# Patient Record
Sex: Male | Born: 1970 | Race: White | Hispanic: No | Marital: Single | Smoking: Current every day smoker
Health system: Southern US, Community
[De-identification: ages and names within clinical notes are randomized; demographics above are authoritative.]

## PROBLEM LIST (undated history)

## (undated) DIAGNOSIS — R011 Cardiac murmur, unspecified: Secondary | ICD-10-CM

---

## 2018-05-20 ENCOUNTER — Emergency Department
Admission: EM | Admit: 2018-05-20 | Discharge: 2018-05-20 | Disposition: A | Discharge status: Home / Self Care | Payer: Self-pay | Attending: Emergency Medicine | Admitting: Emergency Medicine

## 2018-05-20 ENCOUNTER — Emergency Department: Payer: Self-pay

## 2018-05-20 ENCOUNTER — Encounter: Payer: Self-pay | Admitting: Emergency Medicine

## 2018-05-20 DIAGNOSIS — Z87891 Personal history of nicotine dependence: Secondary | ICD-10-CM | POA: Insufficient documentation

## 2018-05-20 DIAGNOSIS — K2900 Acute gastritis without bleeding: Secondary | ICD-10-CM | POA: Insufficient documentation

## 2018-05-20 HISTORY — DX: Cardiac murmur, unspecified: R01.1

## 2018-05-20 LAB — CBC
HCT: 36.5 % — ABNORMAL LOW (ref 40.0–52.0)
Hemoglobin: 12.3 g/dL — ABNORMAL LOW (ref 13.0–18.0)
MCH: 27.6 pg (ref 26.0–34.0)
MCHC: 33.8 g/dL (ref 32.0–36.0)
MCV: 81.7 fL (ref 80.0–100.0)
Platelets: 361 10*3/uL (ref 150–440)
RBC: 4.46 MIL/uL (ref 4.40–5.90)
RDW: 15.1 % — AB (ref 11.5–14.5)
WBC: 11 10*3/uL — AB (ref 3.8–10.6)

## 2018-05-20 LAB — HEPATIC FUNCTION PANEL
ALBUMIN: 3.3 g/dL — AB (ref 3.5–5.0)
ALT: 12 U/L (ref 0–44)
AST: 20 U/L (ref 15–41)
Alkaline Phosphatase: 49 U/L (ref 38–126)
BILIRUBIN TOTAL: 0.5 mg/dL (ref 0.3–1.2)
Bilirubin, Direct: <0.1 mg/dL (ref 0.0–0.2)
Total Protein: 6.4 g/dL — ABNORMAL LOW (ref 6.5–8.1)

## 2018-05-20 LAB — BASIC METABOLIC PANEL
Anion gap: 6 (ref 5–15)
BUN: 8 mg/dL (ref 6–20)
CO2: 26 mmol/L (ref 22–32)
Calcium: 8.7 mg/dL — ABNORMAL LOW (ref 8.9–10.3)
Chloride: 105 mmol/L (ref 98–111)
Creatinine, Ser: 0.96 mg/dL (ref 0.61–1.24)
GFR calc Af Amer: >60 mL/min (ref >60)
GFR calc non Af Amer: >60 mL/min (ref >60)
Glucose, Bld: 142 mg/dL — ABNORMAL HIGH (ref 70–99)
Potassium: 3.9 mmol/L (ref 3.5–5.1)
SODIUM: 137 mmol/L (ref 135–145)

## 2018-05-20 LAB — LIPASE, BLOOD: Lipase: 30 U/L (ref 11–51)

## 2018-05-20 LAB — TROPONIN I: Troponin I: <0.03 ng/mL (ref <0.03)

## 2018-05-20 MED ORDER — HYDROCODONE-ACETAMINOPHEN 5-325 MG PO TABS: 1.0000 | ORAL_TABLET | ORAL | 0 refills | Status: AC | PRN

## 2018-05-20 MED ORDER — PANTOPRAZOLE SODIUM 40 MG PO TBEC: 40.0000 mg | DELAYED_RELEASE_TABLET | Freq: Every day | ORAL | 1 refills | Status: AC

## 2018-05-20 MED ORDER — ALUM & MAG HYDROXIDE-SIMETH 200-200-20 MG/5ML PO SUSP: 30.0000 mL | ORAL | 0 refills | Status: AC | PRN

## 2018-05-20 MED ORDER — GI COCKTAIL ~~LOC~~
30.0000 mL | Freq: Once | ORAL | Status: AC
  Administered 2018-05-20: 30 mL via ORAL
  Filled 2018-05-20: qty 30

## 2018-05-20 NOTE — ED Provider Notes (Signed)
[facility] Emergency Department Provider Note  Time seen: 1:13 PM  I have reviewed the triage vital signs and the nursing notes.   HISTORY  Chief Complaint Abdominal Pain and Chest Pain    HPI Raymond Ellis is a 47 y.o. male with no significant past medical history presents to the emergency department for lower chest/upper abdominal pain.  According to the patient for the past 1 week he has been experiencing a burning sensation across his lower chest and upper abdomen.  States pain across the upper abdomen as well.  Does state intermittent nausea vomiting over the past 1 week denies any diarrhea.  Denies any dysuria hematuria.  No lower abdominal pain.  No known fever cough or congestion.  The patient does smoke cigarettes, denies alcohol use.  Denies any history of pancreatitis or liver disease.   Past Medical History:  Diagnosis Date  . Heart murmur     There are no active problems to display for this patient.   History reviewed. No pertinent surgical history.  Prior to Admission medications   Not on File    Not on File  No family history on file.  Social History Social History   Tobacco Use  . Smoking status: Current Every Day Smoker  . Smokeless tobacco: Former Engineer, water Use Topics  . Alcohol use: Not Currently    Frequency: Never  . Drug use: Not on file    Review of Systems Constitutional: Negative for fever. Eyes: Negative for visual complaints ENT: Negative for recent illness/congestion Cardiovascular: Mild burning across the lower chest/upper abdomen Respiratory: Negative for shortness of breath. Gastrointestinal: Burning across the upper abdomen.  Positive intermittent nausea vomiting.  Negative for diarrhea. Genitourinary: Negative for urinary compaints Musculoskeletal: Negative for musculoskeletal complaints Skin: Negative for skin complaints  Neurological: Negative for headache All other ROS  negative  ____________________________________________   PHYSICAL EXAM:  VITAL SIGNS: ED Triage Vitals  Enc Vitals Group     BP 05/20/18 1231 105/66     Pulse Rate 05/20/18 1231 76     Resp 05/20/18 1231 20     Temp 05/20/18 1231 97.9 F (36.6 C)     Temp Source 05/20/18 1231 Oral     SpO2 05/20/18 1231 95 %     Weight 05/20/18 1234 175 lb (79.4 kg)     Height 05/20/18 1234 5\' 10"  (1.778 m)     Head Circumference --      Peak Flow --      Pain Score 05/20/18 1234 7     Pain Loc --      Pain Edu? --      Excl. in GC? --    Constitutional: Alert and oriented. Well appearing and in no distress. Eyes: Normal exam ENT   Head: Normocephalic and atraumatic   Mouth/Throat: Mucous membranes are moist. Cardiovascular: Normal rate, regular rhythm. No murmur Respiratory: Normal respiratory effort without tachypnea nor retractions. Breath sounds are clear  Gastrointestinal: Soft, mild tenderness across the entire upper abdomen especially in the epigastrium.  No rebound guarding or distention.  Benign lower abdominal exam. Musculoskeletal: Nontender with normal range of motion in all extremities.  Neurologic:  Normal speech and language. No gross focal neurologic deficits  Skin:  Skin is warm, dry and intact.  Psychiatric: Mood and affect are normal. Speech and behavior are normal.   ____________________________________________    EKG  EKG reviewed and interpreted by myself shows normal sinus rhythm at 64 bpm with  a narrow QRS, normal axis, normal intervals, no concerning ST changes.  Reassuring EKG.  ____________________________________________    RADIOLOGY  Chest x-ray is negative  ____________________________________________   INITIAL IMPRESSION / ASSESSMENT AND PLAN / ED COURSE  Pertinent labs & imaging results that were available during my care of the patient were reviewed by me and considered in my medical decision making (see chart for details).  Patient  presents to the emergency department with 1 week of intermittent burning across the upper abdomen and lower chest.  Differential would include ACS, gastritis, gastric or peptic ulcer disease, gallbladder disease, pancreatitis.  We will check labs including LFTs and lipase as well as cardiac enzymes, EKG and chest x-ray.  Overall the patient appears very well.  Will attempt treatment with a GI cocktail.  Patient's work-up is largely nonrevealing, lipase has resulted normal.  LFTs are largely normal.  Troponin normal EKG is reassuring chest x-ray is negative.  Given the patient's location of his discomfort I highly suspect gastritis.  He did state some improvement after GI cocktail.  We will discharge with short course of pain medication, Protonix and have the patient follow-up with GI medicine.  I also discussed use of over-the-counter Maalox.  Patient agreeable to plan of care.  ____________________________________________   FINAL CLINICAL IMPRESSION(S) / ED DIAGNOSES  Lower chest/upper abdominal pain Gastritis   Minna Antis, MD 05/20/18 1527

## 2018-05-20 NOTE — ED Notes (Signed)
Patient transported to X-ray 

## 2018-05-20 NOTE — ED Triage Notes (Signed)
Pt arrived with complaints of upper abdominal and left sided chest pain that has been intermittent for a week +. Pt also reports shortness of breath. Pt reports 1 episode of emesis this morning

## 2018-05-27 ENCOUNTER — Encounter: Payer: Self-pay | Admitting: Gastroenterology

## 2018-05-27 ENCOUNTER — Other Ambulatory Visit: Payer: Self-pay

## 2018-05-27 ENCOUNTER — Ambulatory Visit (INDEPENDENT_AMBULATORY_CARE_PROVIDER_SITE_OTHER): Payer: Self-pay | Admitting: Gastroenterology

## 2018-05-27 VITALS — BP 102/70 | HR 65 | Resp 16 | Wt 173.4 lb

## 2018-05-27 DIAGNOSIS — G8929 Other chronic pain: Secondary | ICD-10-CM

## 2018-05-27 DIAGNOSIS — R1013 Epigastric pain: Principal | ICD-10-CM

## 2018-05-27 DIAGNOSIS — Z720 Tobacco use: Secondary | ICD-10-CM

## 2018-05-27 NOTE — Progress Notes (Signed)
Arlyss Repress, MD [address]  [address]  [place], [place] [address]  Main: [phone]  Fax: [phone]    Gastroenterology Consultation  Referring Provider:     No ref. provider found Primary Care Physician:  Patient, No Pcp Per Primary Gastroenterologist:  Dr. Arlyss Repress Reason for Consultation:     Chronic epigastric pain        HPI:   Raymond Ellis is a 47 y.o. male referred from [facility] ER for consultation & management of chronic epigastric pain. Patient reports that the pain started approximately 6 months ago, pressure-like associated with early satiety, intermittent,experiences almost on a daily basis. He reports that due to his work, he generally eats once a day only. He reports the pain has become progressive, went to ER about a week ago as it got worse and associated with nausea and vomiting. In the ER, he was found to have mild normocytic anemia, lipase was normal, normal LFTs. He did not undergo any abdominal imaging. He was discharged home on Protonix and Maalox with follow-up with GI Patient reports that pain is persistent. He denies diarrhea, however, unable to tolerate milk products He denies taking alcohol, quit about an year ago He smokes 1 pack of cigarettes daily for more than 25 years He works as a Copywriter, advertising for [employer] NSAIDs: none  Antiplts/Anticoagulants/Anti thrombotics: none  GI Procedures: none He denies any GI surgeries He denies family history of GI malignancy  Past Medical History:  Diagnosis Date  . Heart murmur     No past surgical history on file.   Current Outpatient Medications:  .  alum & mag hydroxide-simeth (MAALOX/MYLANTA) 200-200-20 MG/5ML suspension, Take 30 mLs by mouth every 4 (four) hours as needed for indigestion or heartburn., Disp: 355 mL, Rfl: 0 .  pantoprazole (PROTONIX) 40 MG tablet, Take 1 tablet (40 mg total) by mouth daily., Disp: 30 tablet, Rfl: 1 .  HYDROcodone-acetaminophen  (NORCO/VICODIN) 5-325 MG tablet, Take 1 tablet by mouth every 4 (four) hours as needed. (Patient not taking: Reported on 05/27/2018), Disp: 8 tablet, Rfl: 0   No family history on file.   Social History   Tobacco Use  . Smoking status: Current Every Day Smoker  . Smokeless tobacco: Former Engineer, water Use Topics  . Alcohol use: Not Currently    Frequency: Never  . Drug use: Not on file    Allergies as of 05/27/2018  . (Not on File)    Review of Systems:    All systems reviewed and negative except where noted in HPI.   Physical Exam:  BP 102/70 (BP Location: Left Arm, Patient Position: Sitting, Cuff Size: Normal)   Pulse 65   Resp 16   Wt 173 lb 6.4 oz (78.7 kg)   BMI 24.88 kg/m  No LMP for male patient.  General:   Alert,  Well-developed, well-nourished, pleasant and cooperative in NAD Head:  Normocephalic and atraumatic. Eyes:  Sclera clear, no icterus.   Conjunctiva pink. Ears:  Normal auditory acuity. Nose:  No deformity, discharge, or lesions. Mouth:  No deformity or lesions,oropharynx pink & moist. Neck:  Supple; no masses or thyromegaly. Lungs:  Respirations even and unlabored.  Clear throughout to auscultation.   No wheezes, crackles, or rhonchi. No acute distress. Heart:  Regular rate and rhythm; no murmurs, clicks, rubs, or gallops. Abdomen:  Normal bowel sounds. Soft, Mild epigastric fullness and tenderness and non-distended without masses, hepatosplenomegaly or hernias noted.  No  guarding or rebound tenderness.   Rectal: Not performed Msk:  Symmetrical without gross deformities. Good, equal movement & strength bilaterally. Pulses:  Normal pulses noted. Extremities:  No clubbing or edema.  No cyanosis. Neurologic:  Alert and oriented x3;  grossly normal neurologically. Skin:  Intact without significant lesions or rashes. No jaundice. Lymph Nodes:  No significant cervical adenopathy. Psych:  Alert and cooperative. Normal mood and affect.  Imaging  Studies: none  Assessment and Plan:   Raymond Ellis is a 47 y.o. Caucasian male with chronic tobacco use seen in consultation for 6 month history of epigastric pain  Epigastric pain: differentials include peptic ulcer disease or H. Pylori infection or chronic pancreatitis or pancreatic malignancy Recommend EGD for further evaluation Recommend ultrasound abdomen If the above workup negative, recommend CT A/P to evaluate for pancreas continue Protonix in the meantime  Follow up in 4 weeks   Arlyss Repressohini R Kerina Simoneau, MD

## 2018-05-29 ENCOUNTER — Ambulatory Visit
Admission: RE | Admit: 2018-05-29 | Discharge: 2018-05-29 | Disposition: A | Discharge status: Home / Self Care | Payer: PRIVATE HEALTH INSURANCE | Source: Ambulatory Visit | Attending: Gastroenterology | Admitting: Gastroenterology

## 2018-05-29 DIAGNOSIS — R1013 Epigastric pain: Secondary | ICD-10-CM | POA: Diagnosis present

## 2018-05-29 DIAGNOSIS — K7689 Other specified diseases of liver: Secondary | ICD-10-CM | POA: Insufficient documentation

## 2018-05-29 DIAGNOSIS — G8929 Other chronic pain: Secondary | ICD-10-CM | POA: Diagnosis not present

## 2018-05-29 DIAGNOSIS — R918 Other nonspecific abnormal finding of lung field: Secondary | ICD-10-CM | POA: Diagnosis not present

## 2018-06-02 ENCOUNTER — Encounter: Payer: Self-pay | Admitting: *Deleted

## 2018-06-03 ENCOUNTER — Encounter
Admission: RE | Disposition: A | Discharge status: Home / Self Care | Payer: Self-pay | Source: Ambulatory Visit | Attending: Gastroenterology

## 2018-06-03 ENCOUNTER — Ambulatory Visit: Payer: PRIVATE HEALTH INSURANCE | Admitting: Anesthesiology

## 2018-06-03 ENCOUNTER — Encounter: Payer: Self-pay | Admitting: *Deleted

## 2018-06-03 ENCOUNTER — Other Ambulatory Visit: Payer: Self-pay

## 2018-06-03 ENCOUNTER — Ambulatory Visit
Admission: RE | Admit: 2018-06-03 | Discharge: 2018-06-03 | Disposition: A | Discharge status: Home / Self Care | Payer: PRIVATE HEALTH INSURANCE | Source: Ambulatory Visit | Attending: Gastroenterology | Admitting: Gastroenterology

## 2018-06-03 DIAGNOSIS — R011 Cardiac murmur, unspecified: Secondary | ICD-10-CM | POA: Diagnosis not present

## 2018-06-03 DIAGNOSIS — Z79899 Other long term (current) drug therapy: Secondary | ICD-10-CM | POA: Insufficient documentation

## 2018-06-03 DIAGNOSIS — K229 Disease of esophagus, unspecified: Secondary | ICD-10-CM

## 2018-06-03 DIAGNOSIS — F17200 Nicotine dependence, unspecified, uncomplicated: Secondary | ICD-10-CM | POA: Diagnosis not present

## 2018-06-03 DIAGNOSIS — R1013 Epigastric pain: Secondary | ICD-10-CM

## 2018-06-03 DIAGNOSIS — G8929 Other chronic pain: Secondary | ICD-10-CM | POA: Insufficient documentation

## 2018-06-03 DIAGNOSIS — K2960 Other gastritis without bleeding: Secondary | ICD-10-CM | POA: Insufficient documentation

## 2018-06-03 DIAGNOSIS — B9681 Helicobacter pylori [H. pylori] as the cause of diseases classified elsewhere: Secondary | ICD-10-CM | POA: Insufficient documentation

## 2018-06-03 HISTORY — PX: ESOPHAGOGASTRODUODENOSCOPY (EGD) WITH PROPOFOL: SHX5813

## 2018-06-03 SURGERY — ESOPHAGOGASTRODUODENOSCOPY (EGD) WITH PROPOFOL
Anesthesia: General

## 2018-06-03 MED ORDER — PROPOFOL 500 MG/50ML IV EMUL
INTRAVENOUS | Status: AC
  Filled 2018-06-03: qty 50

## 2018-06-03 MED ORDER — EPHEDRINE SULFATE 50 MG/ML IJ SOLN
INTRAMUSCULAR | Status: DC | PRN
  Administered 2018-06-03: 10 mg via INTRAVENOUS

## 2018-06-03 MED ORDER — MIDAZOLAM HCL 2 MG/2ML IJ SOLN
INTRAMUSCULAR | Status: DC | PRN
  Administered 2018-06-03: 2 mg via INTRAVENOUS

## 2018-06-03 MED ORDER — SODIUM CHLORIDE 0.9 % IV SOLN
INTRAVENOUS | Status: DC
  Administered 2018-06-03: 13:00:00 via INTRAVENOUS

## 2018-06-03 MED ORDER — LIDOCAINE HCL (PF) 2 % IJ SOLN
INTRAMUSCULAR | Status: AC
  Filled 2018-06-03: qty 10

## 2018-06-03 MED ORDER — MIDAZOLAM HCL 2 MG/2ML IJ SOLN
INTRAMUSCULAR | Status: AC
  Filled 2018-06-03: qty 2

## 2018-06-03 MED ORDER — FENTANYL CITRATE (PF) 100 MCG/2ML IJ SOLN
INTRAMUSCULAR | Status: AC
  Filled 2018-06-03: qty 2

## 2018-06-03 MED ORDER — LIDOCAINE HCL (CARDIAC) PF 100 MG/5ML IV SOSY
PREFILLED_SYRINGE | INTRAVENOUS | Status: DC | PRN
  Administered 2018-06-03: 30 mg via INTRAVENOUS

## 2018-06-03 MED ORDER — EPHEDRINE SULFATE 50 MG/ML IJ SOLN
INTRAMUSCULAR | Status: AC
  Filled 2018-06-03: qty 1

## 2018-06-03 MED ORDER — PROPOFOL 500 MG/50ML IV EMUL
INTRAVENOUS | Status: DC | PRN
  Administered 2018-06-03: 120 ug/kg/min via INTRAVENOUS

## 2018-06-03 MED ORDER — FENTANYL CITRATE (PF) 100 MCG/2ML IJ SOLN
INTRAMUSCULAR | Status: DC | PRN
  Administered 2018-06-03: 50 ug via INTRAVENOUS

## 2018-06-03 NOTE — Op Note (Signed)
[facility] Gastroenterology Patient Name: Raymond Ellis Procedure Date: 06/03/2018 1:30 PM MRN: [id number] Account #: [id number] Date of Birth: 1970-10-31 Admit Type: Outpatient Age: 47 Room: [facility] ENDO ROOM 4 Gender: Male Note Status: Finalized Procedure:            Upper GI endoscopy Indications:          Epigastric abdominal pain Providers:            Lin Landsman MD, MD Referring MD:         No Local Md, MD (Referring MD) Medicines:            Monitored Anesthesia Care Complications:        No immediate complications. Estimated blood loss: None. Procedure:            Pre-Anesthesia Assessment:                       - Prior to the procedure, a History and Physical was                        performed, and patient medications and allergies were                        reviewed. The patient is competent. The risks and                        benefits of the procedure and the sedation options and                        risks were discussed with the patient. All questions                        were answered and informed consent was obtained.                        Patient identification and proposed procedure were                        verified by the physician, the nurse, the                        anesthesiologist, the anesthetist and the technician in                        the pre-procedure area in the procedure room in the                        endoscopy suite. Mental Status Examination: alert and                        oriented. Airway Examination: normal oropharyngeal                        airway and neck mobility. Respiratory Examination:                        clear to auscultation. CV Examination: normal.                        Prophylactic Antibiotics: The patient does not require  prophylactic antibiotics. Prior Anticoagulants: The                        patient has taken no previous anticoagulant or   antiplatelet agents. ASA Grade Assessment: II - A                        patient with mild systemic disease. After reviewing the                        risks and benefits, the patient was deemed in                        satisfactory condition to undergo the procedure. The                        anesthesia plan was to use monitored anesthesia care                        (MAC). Immediately prior to administration of                        medications, the patient was re-assessed for adequacy                        to receive sedatives. The heart rate, respiratory rate,                        oxygen saturations, blood pressure, adequacy of                        pulmonary ventilation, and response to care were                        monitored throughout the procedure. The physical status                        of the patient was re-assessed after the procedure.                       After obtaining informed consent, the endoscope was                        passed under direct vision. Throughout the procedure,                        the patient's blood pressure, pulse, and oxygen                        saturations were monitored continuously. The Endoscope                        was introduced through the mouth, and advanced to the                        second part of duodenum. The upper GI endoscopy was                        accomplished without difficulty. The patient tolerated  the procedure well. Findings:      The duodenal bulb and second portion of the duodenum were normal.      The entire examined stomach was normal. Biopsies were taken with a cold       forceps for Helicobacter pylori testing.      The cardia and gastric fundus were normal on retroflexion.      Islands of salmon-colored mucosa were present at 38 cm. No other visible       abnormalities were present. Biopsies were taken with a cold forceps for       histology.      The examined esophagus was  normal. Impression:           - Normal duodenal bulb and second portion of the                        duodenum.                       - Normal stomach. Biopsied.                       - Salmon-colored mucosa. Biopsied.                       - Normal esophagus. Recommendation:       - Await pathology results.                       - Discharge patient to home (with escort).                       - Resume previous diet today.                       - Continue present medications.                       - Return to my office as previously scheduled. Procedure Code(s):    --- Professional ---                       [phone], Esophagogastroduodenoscopy, flexible, transoral;                        with biopsy, single or multiple Diagnosis Code(s):    --- Professional ---                       K22.8, Other specified diseases of esophagus                       R10.13, Epigastric pain CPT copyright 2017 American Medical Association. All rights reserved. The codes documented in this report are preliminary and upon coder review may  be revised to meet current compliance requirements. Dr. Ulyess Mort Lin Landsman MD, MD 06/03/2018 1:50:48 PM This report has been signed electronically. Number of Addenda: 0 Note Initiated On: 06/03/2018 1:30 PM      [facility]

## 2018-06-03 NOTE — Anesthesia Preprocedure Evaluation (Addendum)
Anesthesia Evaluation  Patient identified by MRN, date of birth, ID band Patient awake    Reviewed: Allergy & Precautions, H&P , NPO status , Patient's Chart, lab work & pertinent test results  Airway Mallampati: II  TM Distance: >3 FB Neck ROM: full   Comment: beard Dental  (+) Chipped, Missing   Pulmonary neg COPD, Current Smoker,     + decreased breath sounds      Cardiovascular (-) angina(-) Cardiac Stents, (-) CABG and (-) CHF negative cardio ROS  (-) dysrhythmias + Valvular Problems/Murmurs (murmur)  Rhythm:regular Rate:Normal     Neuro/Psych negative neurological ROS  negative psych ROS   GI/Hepatic negative GI ROS, Neg liver ROS,   Endo/Other  negative endocrine ROS  Renal/GU negative Renal ROS  negative genitourinary   Musculoskeletal   Abdominal   Peds  Hematology negative hematology ROS (+)   Anesthesia Other Findings Past Medical History: No date: Heart murmur  History reviewed. No pertinent surgical history.  BMI    Body Mass Index:  25.11 kg/m      Reproductive/Obstetrics negative OB ROS                            Anesthesia Physical Anesthesia Plan  ASA: II  Anesthesia Plan: General   Post-op Pain Management:    Induction:   PONV Risk Score and Plan: Propofol infusion and TIVA  Airway Management Planned: Natural Airway and Nasal Cannula  Additional Equipment:   Intra-op Plan:   Post-operative Plan:   Informed Consent: I have reviewed the patients History and Physical, chart, labs and discussed the procedure including the risks, benefits and alternatives for the proposed anesthesia with the patient or authorized representative who has indicated his/her understanding and acceptance.   Dental Advisory Given  Plan Discussed with: Anesthesiologist, CRNA and Surgeon  Anesthesia Plan Comments:         Anesthesia Quick Evaluation

## 2018-06-03 NOTE — Transfer of Care (Signed)
Immediate Anesthesia Transfer of Care Note  Patient: Raymond Ellis  Procedure(s) Performed: ESOPHAGOGASTRODUODENOSCOPY (EGD) WITH PROPOFOL (N/A )  Patient Location: PACU  Anesthesia Type:General  Level of Consciousness: awake and sedated  Airway & Oxygen Therapy: Patient Spontanous Breathing and Patient connected to nasal cannula oxygen  Post-op Assessment: Report given to RN and Post -op Vital signs reviewed and stable  Post vital signs: Reviewed and stable  Last Vitals:  Vitals Value Taken Time  BP 109/55 06/03/2018  1:51 PM  Temp 36.1 C 06/03/2018  1:50 PM  Pulse 78 06/03/2018  1:51 PM  Resp 15 06/03/2018  1:51 PM  SpO2 98 % 06/03/2018  1:51 PM  Vitals shown include unvalidated device data.  Last Pain:  Vitals:   06/03/18 1350  TempSrc: Tympanic  PainSc:       Patients Stated Pain Goal: 0 (06/03/18 1311)  Complications: No apparent anesthesia complications

## 2018-06-03 NOTE — Anesthesia Post-op Follow-up Note (Signed)
Anesthesia QCDR form completed.        

## 2018-06-03 NOTE — Anesthesia Procedure Notes (Signed)
Performed by: Cook-Martin, Rubie Ficco Pre-anesthesia Checklist: Patient identified, Emergency Drugs available, Suction available, Patient being monitored and Timeout performed Patient Re-evaluated:Patient Re-evaluated prior to induction Oxygen Delivery Method: Nasal cannula Preoxygenation: Pre-oxygenation with 100% oxygen Airway Equipment and Method: Bite block Placement Confirmation: positive ETCO2 and CO2 detector       

## 2018-06-03 NOTE — H&P (Signed)
Arlyss Repress, MD [address]  [address]  [place], [place] [address]  Main: [phone]  Fax: [phone] Pager: [phone]  Primary Care Physician:  Patient, No Pcp Per Primary Gastroenterologist:  Dr. Arlyss Repress  Pre-Procedure History & Physical: HPI:  Raymond Ellis is a 47 y.o. male is here for an endoscopy.   Past Medical History:  Diagnosis Date  . Heart murmur     History reviewed. No pertinent surgical history.  Prior to Admission medications   Medication Sig Start Date End Date Taking? Authorizing Provider  alum & mag hydroxide-simeth (MAALOX/MYLANTA) 200-200-20 MG/5ML suspension Take 30 mLs by mouth every 4 (four) hours as needed for indigestion or heartburn. 05/20/18  Yes Minna Antis, MD  HYDROcodone-acetaminophen (NORCO/VICODIN) 5-325 MG tablet Take 1 tablet by mouth every 4 (four) hours as needed. 05/20/18  Yes Minna Antis, MD  pantoprazole (PROTONIX) 40 MG tablet Take 1 tablet (40 mg total) by mouth daily. 05/20/18 05/20/19 Yes Minna Antis, MD    Allergies as of 05/27/2018  . (Not on File)    History reviewed. No pertinent family history.  Social History   Socioeconomic History  . Marital status: Single    Spouse name: Not on file  . Number of children: Not on file  . Years of education: Not on file  . Highest education level: Not on file  Occupational History  . Not on file  Social Needs  . Financial resource strain: Not on file  . Food insecurity:    Worry: Not on file    Inability: Not on file  . Transportation needs:    Medical: Not on file    Non-medical: Not on file  Tobacco Use  . Smoking status: Current Every Day Smoker  . Smokeless tobacco: Former Engineer, water and Sexual Activity  . Alcohol use: Not Currently    Frequency: Never  . Drug use: Never  . Sexual activity: Never  Lifestyle  . Physical activity:    Days per week: Not on file    Minutes per session: Not on file  . Stress: Not on file    Relationships  . Social connections:    Talks on phone: Not on file    Gets together: Not on file    Attends religious service: Not on file    Active member of club or organization: Not on file    Attends meetings of clubs or organizations: Not on file    Relationship status: Not on file  . Intimate partner violence:    Fear of current or ex partner: No    Emotionally abused: No    Physically abused: No    Forced sexual activity: Not on file  Other Topics Concern  . Not on file  Social History Narrative  . Not on file    Review of Systems: See HPI, otherwise negative ROS  Physical Exam: BP (!) 107/6   Pulse 61   Temp (!) 96.9 F (36.1 C) (Tympanic)   Resp 20   Ht 5\' 10"  (1.778 m)   Wt 79.4 kg   SpO2 99%   BMI 25.11 kg/m  General:   Alert,  pleasant and cooperative in NAD Head:  Normocephalic and atraumatic. Neck:  Supple; no masses or thyromegaly. Lungs:  Clear throughout to auscultation.    Heart:  Regular rate and rhythm. Abdomen:  Soft, nontender and nondistended. Normal bowel sounds, without guarding, and without rebound.   Neurologic:  Alert and  oriented x4;  grossly normal neurologically.  Impression/Plan: Raymond Ellis is here for an endoscopy to be performed for chronic epigastric pain  Risks, benefits, limitations, and alternatives regarding  endoscopy have been reviewed with the patient.  Questions have been answered.  All parties agreeable.   Lannette Donathohini Teshara Moree, MD  06/03/2018, 1:24 PM

## 2018-06-05 ENCOUNTER — Encounter: Payer: Self-pay | Admitting: Gastroenterology

## 2018-06-05 LAB — SURGICAL PATHOLOGY

## 2018-06-05 NOTE — Anesthesia Postprocedure Evaluation (Signed)
Anesthesia Post Note  Patient: Raymond Ellis  Procedure(s) Performed: ESOPHAGOGASTRODUODENOSCOPY (EGD) WITH PROPOFOL (N/A )  Patient location during evaluation: PACU Anesthesia Type: General Level of consciousness: awake and alert Pain management: pain level controlled Vital Signs Assessment: post-procedure vital signs reviewed and stable Respiratory status: spontaneous breathing, nonlabored ventilation and respiratory function stable Cardiovascular status: blood pressure returned to baseline and stable Postop Assessment: no apparent nausea or vomiting Anesthetic complications: no     Last Vitals:  Vitals:   06/03/18 1311 06/03/18 1350  BP: (!) 107/6 (!) 109/55  Pulse: 61   Resp: 20   Temp: (!) 36.1 C (!) 36.1 C  SpO2: 99%     Last Pain:  Vitals:   06/04/18 0755  TempSrc:   PainSc: 0-No pain                 Jovita Gamma

## 2018-06-06 ENCOUNTER — Other Ambulatory Visit: Payer: Self-pay

## 2018-06-06 DIAGNOSIS — A048 Other specified bacterial intestinal infections: Secondary | ICD-10-CM

## 2018-06-06 MED ORDER — OMEPRAZOLE 40 MG PO CPDR: 40.0000 mg | DELAYED_RELEASE_CAPSULE | Freq: Every day | ORAL | 0 refills | Status: AC

## 2018-06-06 MED ORDER — CLARITHROMYCIN 250 MG PO TABS: 250.0000 mg | ORAL_TABLET | Freq: Two times a day (BID) | ORAL | 0 refills | Status: AC

## 2018-06-06 MED ORDER — AMOXICILLIN 500 MG PO TABS: 500.0000 mg | ORAL_TABLET | Freq: Two times a day (BID) | ORAL | 0 refills | Status: AC

## 2018-07-01 ENCOUNTER — Other Ambulatory Visit: Payer: Self-pay

## 2018-07-01 ENCOUNTER — Encounter: Payer: Self-pay | Admitting: Gastroenterology

## 2018-07-01 ENCOUNTER — Ambulatory Visit: Payer: Self-pay | Admitting: Gastroenterology

## 2018-07-01 DIAGNOSIS — R1013 Epigastric pain: Secondary | ICD-10-CM

## 2018-11-19 IMAGING — US US ABDOMEN COMPLETE
1 series · 14 of 25 positions shown · non-contrast
Comparison: None.

CLINICAL DATA: Chronic epigastric abdominal pain.

EXAM:
ABDOMEN ULTRASOUND COMPLETE

[Series 1: us abdomen complete · 14 of 99 slices shown]
[im 1/99]
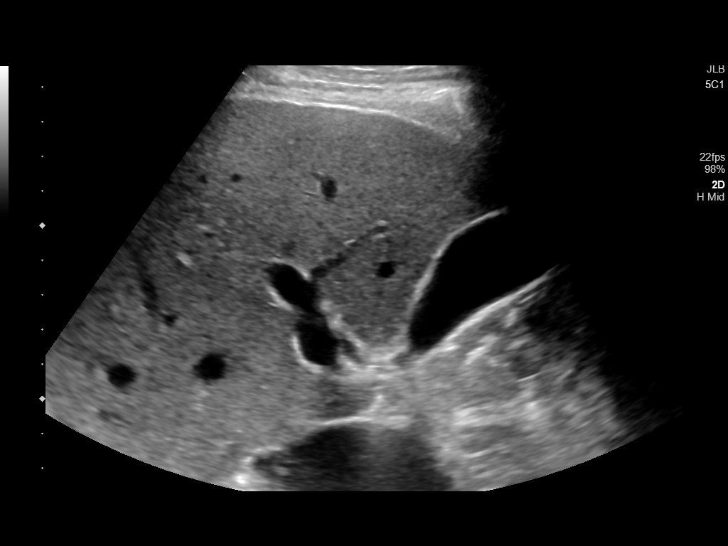
[im 9/99]
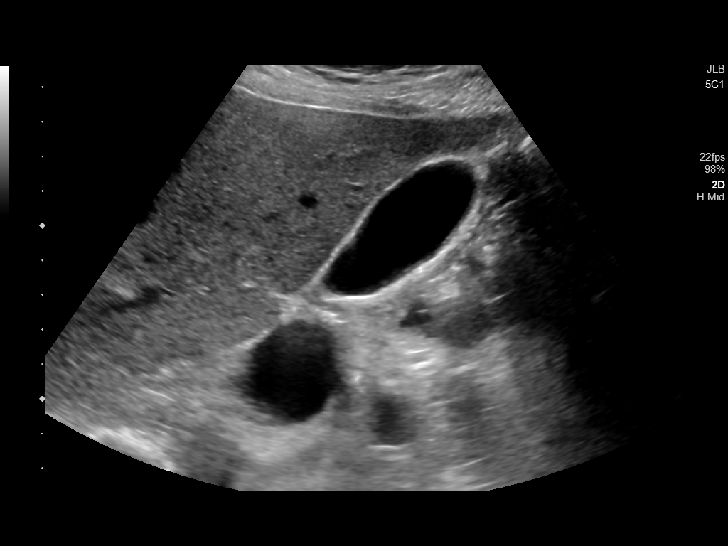
[im 17/99]
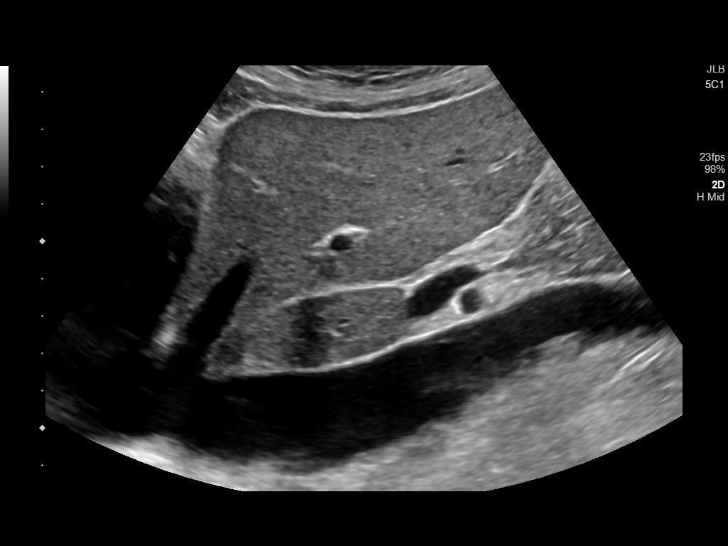
[im 25/99]
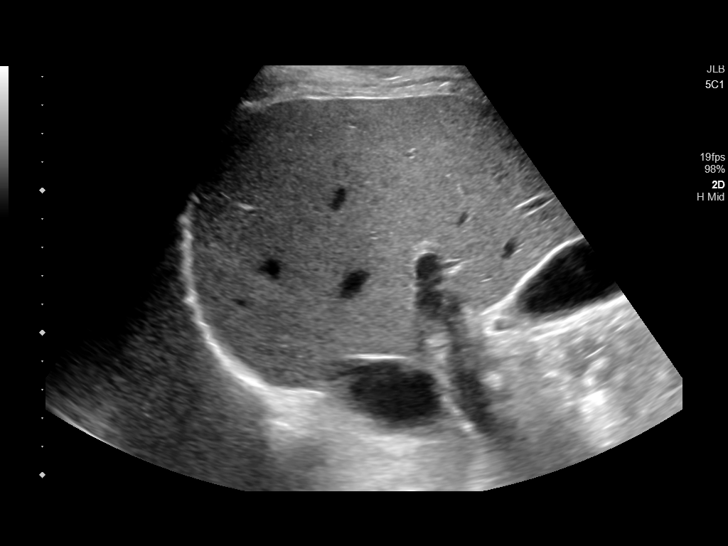
[im 33/99]
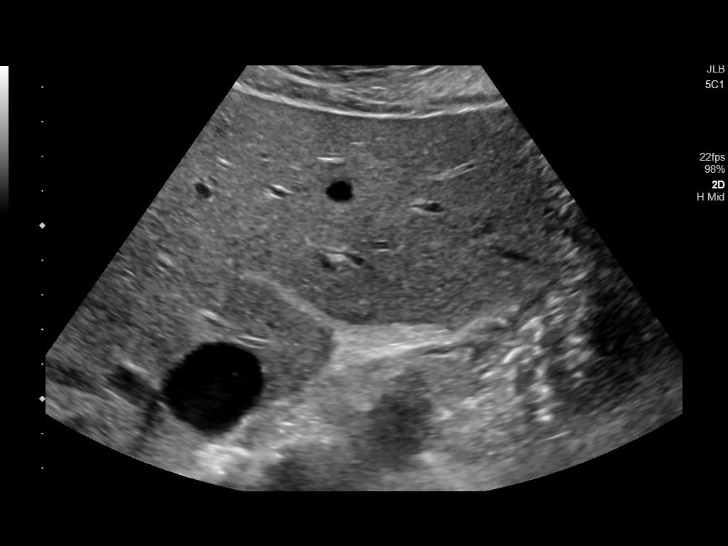
[im 37/99]
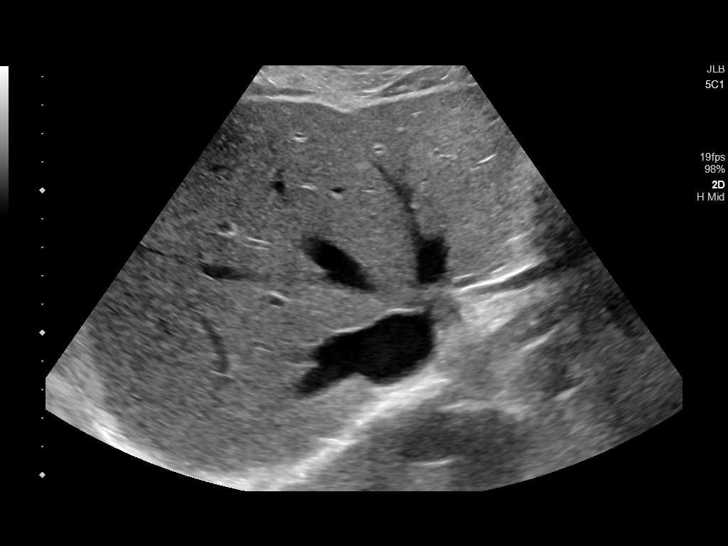
[im 45/99]
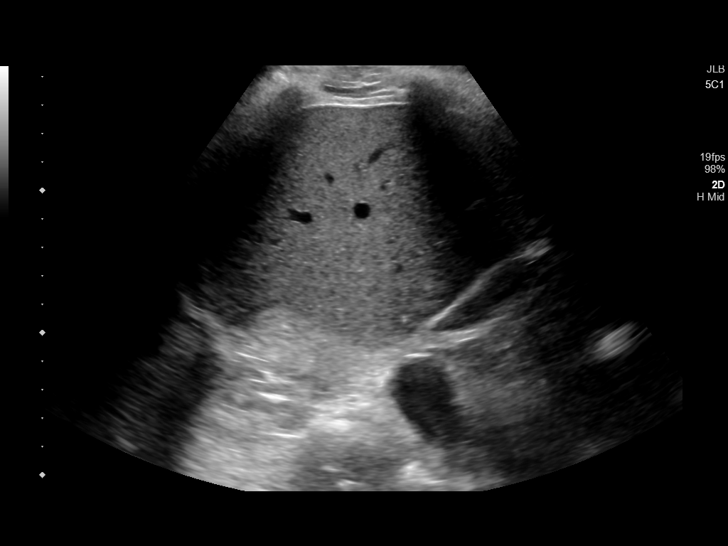
[im 54/99]
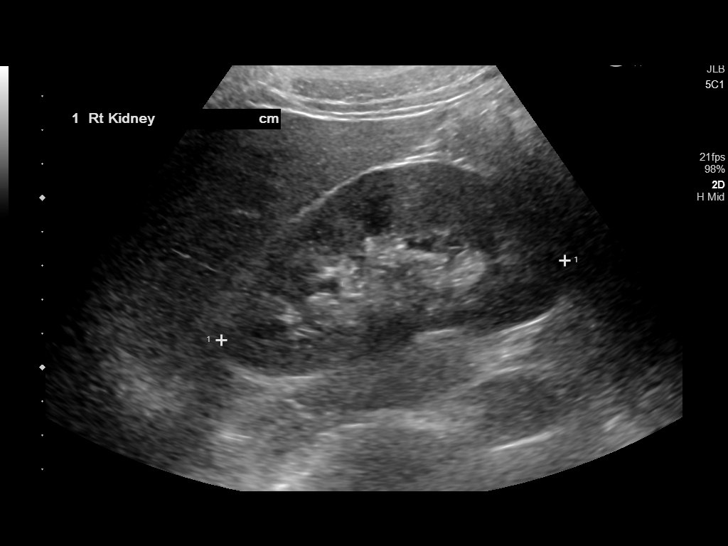
[im 62/99]
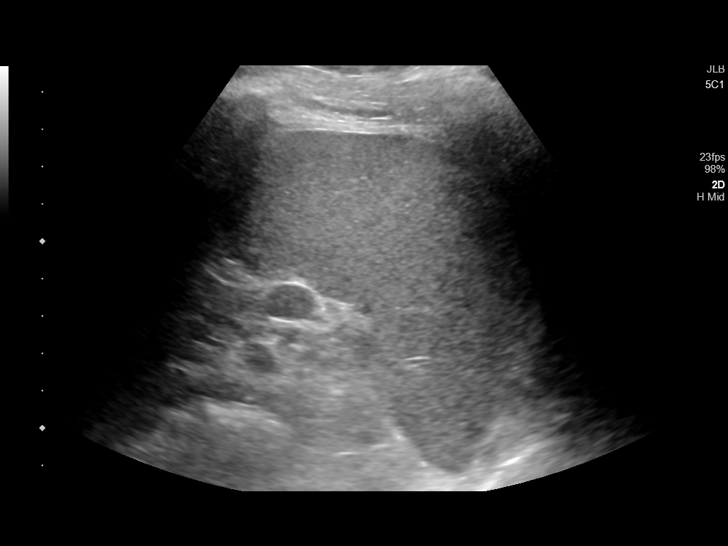
[im 66/99]
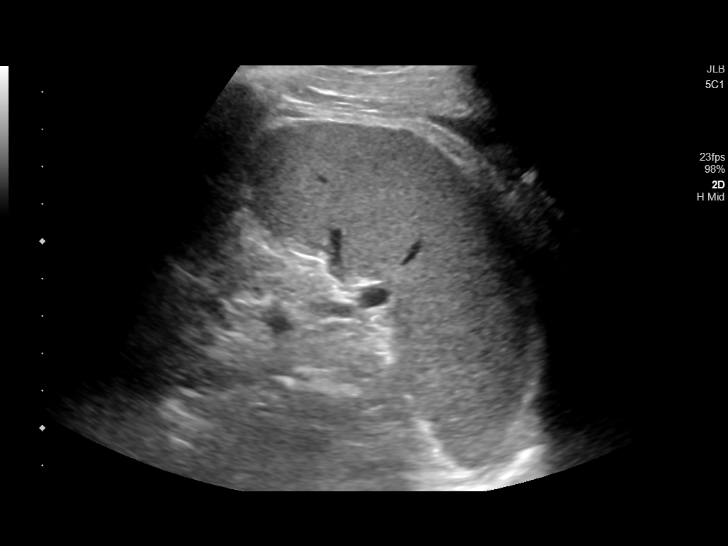
[im 74/99]
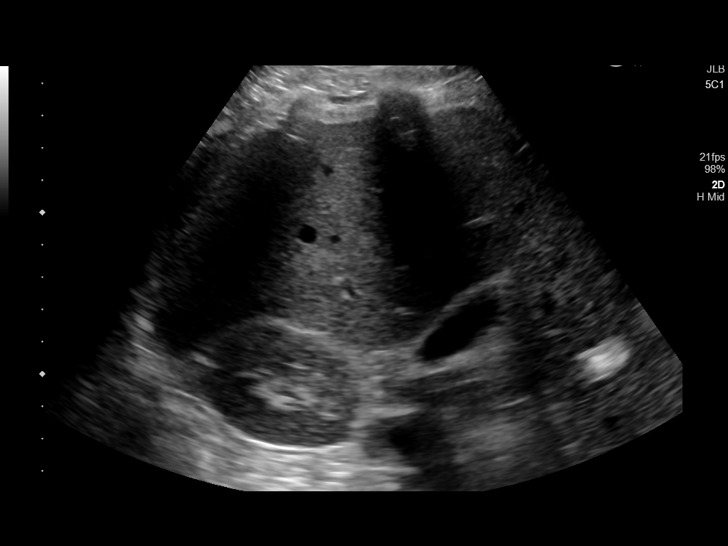
[im 82/99]
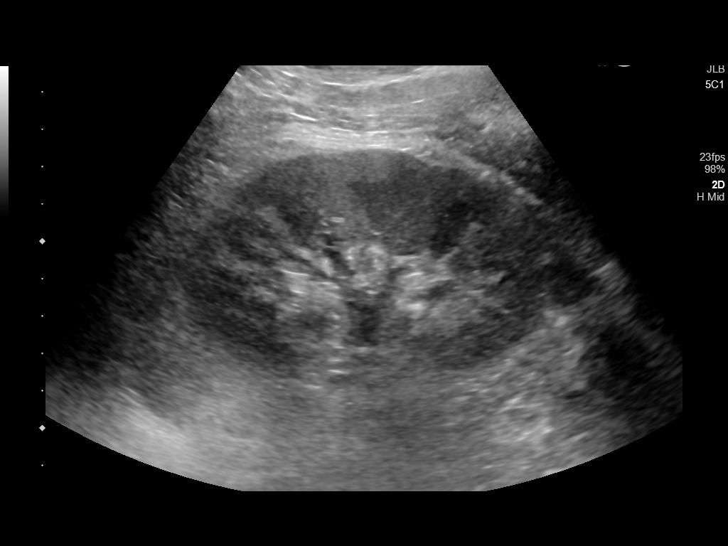
[im 90/99]
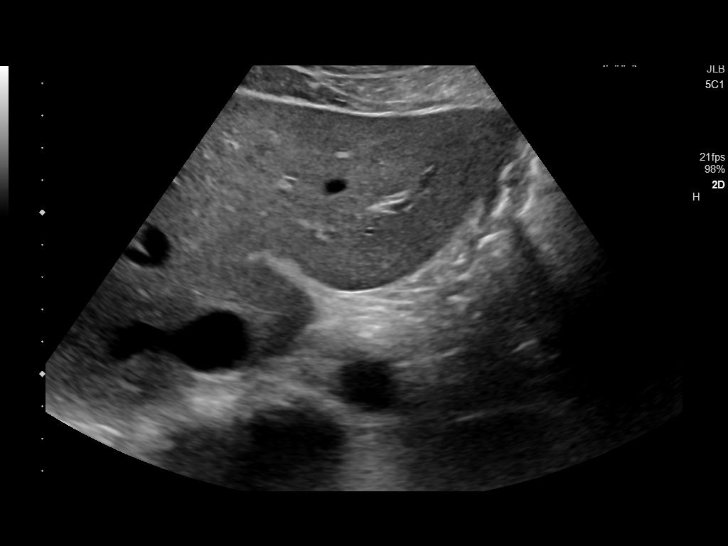
[im 99/99]
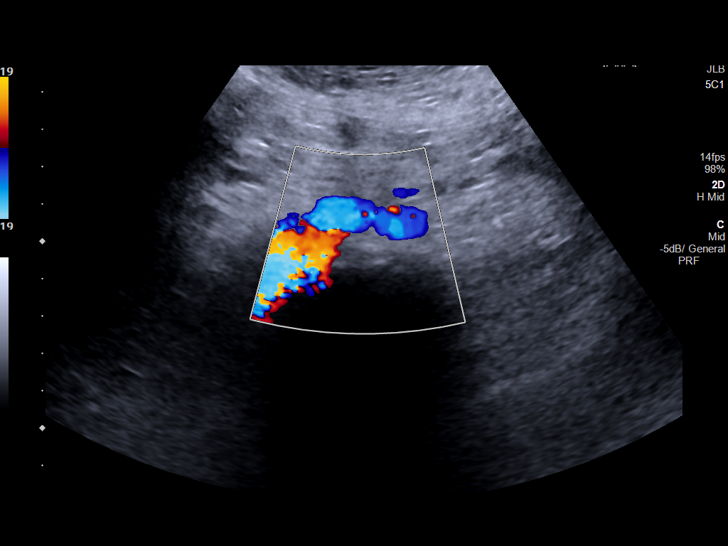

[14 of 25 positions shown; findings below may reference images not displayed]

FINDINGS: Gallbladder: No gallstones or wall thickening visualized. No
sonographic Murphy sign noted by sonographer.

Common bile duct: Diameter: 1.6 mm

Liver: Normal hepatic echogenicity. No intrahepatic biliary
dilatation. A 9 mm cyst is noted in the right hepatic lobe. No
worrisome hepatic lesions. Portal vein is patent on color Doppler
imaging with normal direction of blood flow towards the liver.

IVC: Normal caliber.

Pancreas: Sonographically normal.

Spleen: Normal size.  No focal lesions.

Right Kidney: Length: 10.4 cm.. Normal renal cortical thickness and
echogenicity without focal lesions or hydronephrosis.

Left Kidney: Length: 10.6 cm. Normal renal cortical thickness and
echogenicity without focal lesions or hydronephrosis.

Abdominal aorta: Normal caliber.

Other findings: Possible small left effusion.
IMPRESSION: 1. Normal gallbladder and normal caliber common bile duct.
2. 9 mm right hepatic lobe cyst.
3. Normal sonographic appearance of the pancreas, spleen and both
kidneys.
4. Possible small left pleural effusion.
